# Patient Record
Sex: Male | Born: 1965 | Race: Black or African American | Hispanic: No | Marital: Single | Smoking: Former smoker
Health system: Southern US, Community
[De-identification: ages and names within clinical notes are randomized; demographics above are authoritative.]

---

## 2008-04-04 ENCOUNTER — Emergency Department: Payer: Self-pay | Admitting: Emergency Medicine

## 2016-10-04 ENCOUNTER — Ambulatory Visit: Payer: Self-pay | Admitting: Surgery

## 2016-10-04 NOTE — H&P (Signed)
History of Present Illness Cody Ellis(Cody Bourdeau K. Mekaylah Klich MD; 10/04/2016 9:25 AM) The patient is a 51 year old male who presents with a complaint of Mass. Referred by Dr. Burnell BlanksMaura Ellis for mass on posterior neck This is a healthy 51 year old male who presents with a 3 year history of a slowly enlarging mass on the posterior right side of his neck. This has become much more noticeable and occasionally causes some pain. It has never become infected. 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 Occasionally there is associated soreness with the mass.   Problem List/Past Medical [name](Cody Ellis K. 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 Cody Djordjevic, MD; 10/04/2016 9:26 AM) No pertinent past surgical history  Diagnostic Studies History (Cody Ellis K. Tiwanna Tuch, MD; 10/04/2016 9:26 AM) Colonoscopy never  Allergies [name](Cody Ellis K. Mozes Sagar, MD; 10/04/2016 9:26 AM) No Known Allergies 10/04/2016  Medication History Cody Ellis(Cody Appleman K. Sylvan Lahm, MD; 10/04/2016 9:26 AM) No Current Medications  Social History Cody Ellis(Lucciana Head K. Aastha Dayley, MD; 10/04/2016 9:26 AM) No alcohol use No caffeine use No drug use Tobacco use Never smoker.  Family History Cody Ellis(Cody Carvell K. Alton Tremblay, MD; 10/04/2016 9:26 AM) First Degree Relatives No pertinent family history  Other Problems Cody Ellis(Cody Ellis K. Kailey Esquilin, MD; 10/04/2016 9:26 AM) No pertinent past medical history     Physical Exam [name](Richele Strand K. Stoy Fenn MD; 10/04/2016 9:26 AM)  The physical exam findings are as follows: Note:WDWN in NAD Eyes: Pupils equal, round; sclera anicteric HENT: Oral mucosa moist; good dentition Neck: no thyromegaly; posterior right neck there is a 3 cm subcutaneous mass that is easily visible. This seems to be fairly well demarcated. No sign of inflammation or infection. Lungs: CTA bilaterally; normal respiratory effort CV: Regular rate and rhythm; no murmurs; extremities  well-perfused with no edema Abd: +bowel sounds, soft, non-tender, no palpable organomegaly; no palpable hernias Skin: Warm, dry; no sign of jaundice Psychiatric - alert and oriented x 4; calm mood and affect    Assessment & Plan [name](Roberta Angell K. 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 The surgical procedure has been discussed with the patient. Potential risks, benefits, alternative treatments, and expected outcomes have been explained. All of the patient's questions at this time have been answered. The likelihood of reaching the patient's treatment goal is good. The patient understand the proposed surgical procedure and wishes to proceed.  Cody ArmsMatthew K. Corliss Skainssuei, MD, [facility]  General/ Trauma Surgery  10/04/2016 9:26 AM

## 2017-05-29 ENCOUNTER — Other Ambulatory Visit: Payer: Self-pay | Admitting: Family Medicine

## 2017-05-29 ENCOUNTER — Ambulatory Visit
Admission: RE | Admit: 2017-05-29 | Discharge: 2017-05-29 | Disposition: A | Discharge status: Home / Self Care | Payer: BLUE CROSS/BLUE SHIELD | Source: Ambulatory Visit | Attending: Family Medicine | Admitting: Family Medicine

## 2017-05-29 DIAGNOSIS — R52 Pain, unspecified: Secondary | ICD-10-CM

## 2019-04-16 ENCOUNTER — Ambulatory Visit (HOSPITAL_COMMUNITY)
Admission: EM | Admit: 2019-04-16 | Discharge: 2019-04-16 | Disposition: A | Discharge status: Home / Self Care | Payer: Self-pay | Attending: Urgent Care | Admitting: Urgent Care

## 2019-04-16 ENCOUNTER — Other Ambulatory Visit: Payer: Self-pay

## 2019-04-16 ENCOUNTER — Encounter (HOSPITAL_COMMUNITY): Payer: Self-pay | Admitting: Urgent Care

## 2019-04-16 DIAGNOSIS — L0291 Cutaneous abscess, unspecified: Secondary | ICD-10-CM

## 2019-04-16 DIAGNOSIS — R109 Unspecified abdominal pain: Secondary | ICD-10-CM

## 2019-04-16 MED ORDER — SULFAMETHOXAZOLE-TRIMETHOPRIM 800-160 MG PO TABS: 1.0000 | ORAL_TABLET | Freq: Two times a day (BID) | ORAL | 0 refills | Status: AC

## 2019-04-16 MED ORDER — LIDOCAINE-EPINEPHRINE (PF) 2 %-1:200000 IJ SOLN
INTRAMUSCULAR | Status: AC
  Filled 2019-04-16: qty 20

## 2019-04-16 NOTE — Discharge Instructions (Addendum)
You may take 500mg  Tylenol with ibuprofen 400-600mg  every 6 hours for pain and inflammation associated with your abscess.   Please change your dressing 2-3 times daily. Do not apply any ointments or creams. Each time you change your dressing, make sure you clean gently around the perimeter of the wound with gentle soap and warm water. Cover it using non-stick gauze and medical tape.

## 2019-04-16 NOTE — ED Provider Notes (Signed)
  [facility]   MRN: [id number] DOB: 1965-04-13  Subjective:   Cody Ellis is a 53 y.o. male presenting for 1 week history acute onset worsening boil over left upper flank side.  Patient states that the infection has not drained.  It is very painful and feels hard.  Has not taken medications for relief.  He is not currently taking any medications and has no known food or drug allergies.  Denies past medical and surgical history.   Family History  Problem Relation Age of Onset  . Healthy Mother   . Healthy Father     Social History   Tobacco Use  . Smoking status: Never Smoker  . Smokeless tobacco: Never Used  Substance Use Topics  . Alcohol use: Never  . Drug use: Not on file    ROS Denies fevers, nausea, vomiting, chest pain, belly pain.  Denies history of MRSA infection.  Objective:   Vitals: BP (!) 152/82 (BP Location: Left Arm)   Pulse 76   Temp 98.2 F (36.8 C) (Oral)   Resp 16   SpO2 99%   Physical Exam Constitutional:      General: He is not in acute distress.    Appearance: Normal appearance. He is well-developed and normal weight. He is not ill-appearing, toxic-appearing or diaphoretic.  HENT:     Head: Normocephalic and atraumatic.     Right Ear: External ear normal.     Left Ear: External ear normal.     Nose: Nose normal.     Mouth/Throat:     Pharynx: Oropharynx is clear.  Eyes:     General: No scleral icterus.       Right eye: No discharge.        Left eye: No discharge.     Extraocular Movements: Extraocular movements intact.     Pupils: Pupils are equal, round, and reactive to light.  Cardiovascular:     Rate and Rhythm: Normal rate.  Pulmonary:     Effort: Pulmonary effort is normal.  Chest:    Musculoskeletal:     Cervical back: Normal range of motion.  Neurological:     Mental Status: He is alert and oriented to person, place, and time.  Psychiatric:        Mood and Affect: Mood normal.        Behavior: Behavior  normal.        Thought Content: Thought content normal.        Judgment: Judgment normal.     PROCEDURE NOTE: I&D of Abscess Verbal consent obtained. Local anesthesia with 5cc of 2% lidocaine with epinephrine. Site cleansed with chlorhexidine. Incision of 1 cm was made using a 11 blade, discharge of ~2cc pus and serosanguinous fluid. Wound cavity was explored with curved hemostats and depth of wound is about 1 cm, tracks medially about 2 cm. Cleansed and dressed.   Assessment and Plan :   1. Abscess   2. Left flank pain     I&D performed successfully.  No packing was placed.  Patient is to use warm compresses at home, wound care reviewed.  Start Bactrim.  Use Tylenol and ibuprofen for pain and inflammation. Counseled patient on potential for adverse effects with medications prescribed/recommended today, ER and return-to-clinic precautions discussed, patient verbalized understanding.    Wallis Bamberg, [place] 04/16/19 [phone]

## 2019-04-16 NOTE — ED Triage Notes (Signed)
Patient presents to Urgent Care with complaints of small abscess under left axilla since almost a week ago. Patient reports it is tender to the touch, has not been draining.

## 2020-03-24 ENCOUNTER — Other Ambulatory Visit: Payer: Self-pay

## 2020-03-24 ENCOUNTER — Emergency Department (HOSPITAL_COMMUNITY)
Admission: EM | Admit: 2020-03-24 | Discharge: 2020-03-24 | Disposition: A | Discharge status: Home / Self Care | Payer: Commercial Managed Care - PPO | Attending: Emergency Medicine | Admitting: Emergency Medicine

## 2020-03-24 ENCOUNTER — Encounter (HOSPITAL_COMMUNITY): Payer: Self-pay

## 2020-03-24 ENCOUNTER — Emergency Department (HOSPITAL_COMMUNITY): Payer: Commercial Managed Care - PPO

## 2020-03-24 DIAGNOSIS — R109 Unspecified abdominal pain: Secondary | ICD-10-CM

## 2020-03-24 DIAGNOSIS — Z87891 Personal history of nicotine dependence: Secondary | ICD-10-CM | POA: Diagnosis not present

## 2020-03-24 DIAGNOSIS — R1012 Left upper quadrant pain: Secondary | ICD-10-CM | POA: Insufficient documentation

## 2020-03-24 DIAGNOSIS — R1032 Left lower quadrant pain: Secondary | ICD-10-CM | POA: Diagnosis not present

## 2020-03-24 LAB — CBC WITH DIFFERENTIAL/PLATELET
Abs Immature Granulocytes: 0.01 10*3/uL (ref 0.00–0.07)
Basophils Absolute: 0 10*3/uL (ref 0.0–0.1)
Basophils Relative: 1 %
Eosinophils Absolute: 0 10*3/uL (ref 0.0–0.5)
Eosinophils Relative: 1 %
HCT: 41.5 % (ref 39.0–52.0)
Hemoglobin: 12.9 g/dL — ABNORMAL LOW (ref 13.0–17.0)
Immature Granulocytes: 0 %
Lymphocytes Relative: 40 %
Lymphs Abs: 1.7 10*3/uL (ref 0.7–4.0)
MCH: 25.7 pg — ABNORMAL LOW (ref 26.0–34.0)
MCHC: 31.1 g/dL (ref 30.0–36.0)
MCV: 82.7 fL (ref 80.0–100.0)
Monocytes Absolute: 0.4 10*3/uL (ref 0.1–1.0)
Monocytes Relative: 10 %
Neutro Abs: 2 10*3/uL (ref 1.7–7.7)
Neutrophils Relative %: 48 %
Platelets: 218 10*3/uL (ref 150–400)
RBC: 5.02 MIL/uL (ref 4.22–5.81)
RDW: 12.9 % (ref 11.5–15.5)
WBC: 4.3 10*3/uL (ref 4.0–10.5)
nRBC: 0 % (ref 0.0–0.2)

## 2020-03-24 LAB — URINALYSIS, ROUTINE W REFLEX MICROSCOPIC
Bilirubin Urine: NEGATIVE
Glucose, UA: NEGATIVE mg/dL
Hgb urine dipstick: NEGATIVE
Ketones, ur: NEGATIVE mg/dL
Leukocytes,Ua: NEGATIVE
Nitrite: NEGATIVE
Protein, ur: NEGATIVE mg/dL
Specific Gravity, Urine: 1.015 (ref 1.005–1.030)
pH: 7 (ref 5.0–8.0)

## 2020-03-24 LAB — COMPREHENSIVE METABOLIC PANEL
ALT: 20 U/L (ref 0–44)
AST: 24 U/L (ref 15–41)
Albumin: 4.4 g/dL (ref 3.5–5.0)
Alkaline Phosphatase: 75 U/L (ref 38–126)
Anion gap: 11 (ref 5–15)
BUN: 9 mg/dL (ref 6–20)
CO2: 23 mmol/L (ref 22–32)
Calcium: 9 mg/dL (ref 8.9–10.3)
Chloride: 103 mmol/L (ref 98–111)
Creatinine, Ser: 0.86 mg/dL (ref 0.61–1.24)
GFR, Estimated: >60 mL/min (ref >60)
Glucose, Bld: 139 mg/dL — ABNORMAL HIGH (ref 70–99)
Potassium: 3.4 mmol/L — ABNORMAL LOW (ref 3.5–5.1)
Sodium: 137 mmol/L (ref 135–145)
Total Bilirubin: 0.4 mg/dL (ref 0.3–1.2)
Total Protein: 7.8 g/dL (ref 6.5–8.1)

## 2020-03-24 LAB — LIPASE, BLOOD: Lipase: 35 U/L (ref 11–51)

## 2020-03-24 MED ORDER — IOHEXOL 300 MG/ML  SOLN
100.0000 mL | Freq: Once | INTRAMUSCULAR | Status: AC | PRN
  Administered 2020-03-24: 100 mL via INTRAVENOUS

## 2020-03-24 NOTE — Discharge Instructions (Addendum)
Your blood tests and CT scan of the abdomen were normal.  You should call to find a new primary care provider in the area.

## 2020-03-24 NOTE — ED Triage Notes (Addendum)
Pt c/o headache and generalized abdominal pain radiating to the groin area x3 weeks. Pt denies n/v/d, but sts his stool today looked "different". Pt sts when he takes ibuprofen all of the pain goes away, but comes back when the medicine wears off. Pt sts he feels something hard when the pain moves.

## 2020-03-24 NOTE — ED Notes (Signed)
Pt made aware of the need for a urine sample. °

## 2020-03-24 NOTE — ED Provider Notes (Signed)
[facility]-EMERGENCY DEPT Provider Note   CSN: [id number] Arrival date & time: 03/24/20  1433     History Chief Complaint  Patient presents with  . Abdominal Pain    Cody Ellis is a 55 y.o. male w/ no known significant history presenting to the ED with abdominal pain, ongoing for 3-4 weeks.  He has a nondescript "hardness" that appears at times in his stomach, left lower and left upper side, and comes and goes at random.  Seems worse with bowel movements.  No dysuria, hematuria.  NO known hx of hernia.  Eating and having regular BM without nausea, vomiting.  No fevers or chills  Pain seems to improve with NSAIDS/ibuprofen at home.  He does not have a PCP  NKDA.  Takes no medications  HPI     History reviewed. No pertinent past medical history.  There are no problems to display for this patient.   History reviewed. No pertinent surgical history.     History reviewed. No pertinent family history.  Social History   Tobacco Use  . Smoking status: Former Smoker    Types: Cigarettes    Quit date: 10/15/2007    Years since quitting: 12.4  . Smokeless tobacco: Never Used  Vaping Use  . Vaping Use: Never used  Substance Use Topics  . Alcohol use: Never  . Drug use: Never    Home Medications Prior to Admission medications   Not on File    Allergies    Patient has no known allergies.  Review of Systems   Review of Systems  Constitutional: Negative for chills and fever.  HENT: Negative for ear pain and sore throat.   Eyes: Negative for pain and visual disturbance.  Respiratory: Negative for cough and shortness of breath.   Cardiovascular: Negative for chest pain and palpitations.  Gastrointestinal: Positive for abdominal pain. Negative for diarrhea and vomiting.  Genitourinary: Negative for dysuria and hematuria.  Musculoskeletal: Negative for arthralgias and back pain.  Skin: Negative for color change and rash.  Neurological:  Negative for seizures and syncope.  All other systems reviewed and are negative.   Physical Exam Updated Vital Signs BP 129/82   Pulse 70   Temp 98.8 F (37.1 C) (Oral)   Resp 16   Ht 6\' 2"  (1.88 m)   Wt 74.8 kg   SpO2 100%   BMI 21.18 kg/m   Physical Exam Constitutional:      General: He is not in acute distress. HENT:     Head: Normocephalic and atraumatic.  Eyes:     Conjunctiva/sclera: Conjunctivae normal.     Pupils: Pupils are equal, round, and reactive to light.  Cardiovascular:     Rate and Rhythm: Normal rate and regular rhythm.  Pulmonary:     Effort: Pulmonary effort is normal. No respiratory distress.  Abdominal:     General: There is no distension.     Tenderness: There is no abdominal tenderness. There is no guarding or rebound. Negative signs include Murphy's sign and McBurney's sign.  Skin:    General: Skin is warm and dry.  Neurological:     General: No focal deficit present.     Mental Status: He is alert. Mental status is at baseline.  Psychiatric:        Mood and Affect: Mood normal.        Behavior: Behavior normal.     ED Results / Procedures / Treatments   Labs (all labs ordered are listed,  but only abnormal results are displayed) Labs Reviewed  COMPREHENSIVE METABOLIC PANEL - Abnormal; Notable for the following components:      Result Value   Potassium 3.4 (*)    Glucose, Bld 139 (*)    All other components within normal limits  CBC WITH DIFFERENTIAL/PLATELET - Abnormal; Notable for the following components:   Hemoglobin 12.9 (*)    MCH 25.7 (*)    All other components within normal limits  LIPASE, BLOOD  URINALYSIS, ROUTINE W REFLEX MICROSCOPIC    EKG EKG Interpretation  Date/Time:  Friday March 24 2020 15:08:19 EST Ventricular Rate:  75 PR Interval:    QRS Duration: 110 QT Interval:  362 QTC Calculation: 405 R Axis:   4 Text Interpretation: Sinus rhythm Multiple ventricular premature complexes Abnormal R-wave  progression, early transition 12 Lead; Mason-Likar No STEMI Confirmed by Alvester Chou [phone]) on 03/24/2020 3:13:14 PM   Radiology CT ABDOMEN PELVIS W CONTRAST  Result Date: 03/24/2020 CLINICAL DATA:  Generalized abdominal pain radiating to groin EXAM: CT ABDOMEN AND PELVIS WITH CONTRAST TECHNIQUE: Multidetector CT imaging of the abdomen and pelvis was performed using the standard protocol following bolus administration of intravenous contrast. CONTRAST:  OMNIPAQUE IOHEXOL 300 MG/ML  SOLN COMPARISON:  None. FINDINGS: Lower chest: Lung bases are clear. No effusions. Heart is normal size. Hepatobiliary: No focal hepatic abnormality. Gallbladder unremarkable. Pancreas: No focal abnormality or ductal dilatation. Spleen: No focal abnormality.  Normal size. Adrenals/Urinary Tract: No adrenal abnormality. No focal renal abnormality. No stones or hydronephrosis. Urinary bladder is unremarkable. Stomach/Bowel: Stomach, large and small bowel grossly unremarkable. Normal appendix. Vascular/Lymphatic: No evidence of aneurysm or adenopathy. Reproductive: No visible focal abnormality. Other: No free fluid or free air. Musculoskeletal: No acute bony abnormality. IMPRESSION: No acute findings in the abdomen or pelvis. Electronically Signed   By: Charlett Nose M.D.   On: 03/24/2020 19:49    Procedures Procedures   Medications Ordered in ED Medications  iohexol (OMNIPAQUE) 300 MG/ML solution 100 mL (100 mLs Intravenous Contrast Given 03/24/20 1922)    ED Course  I have reviewed the triage vital signs and the nursing notes.  Pertinent labs & imaging results that were available during my care of the patient were reviewed by me and considered in my medical decision making (see chart for details).  This patient presents to the Emergency Department with complaint of abdominal pain. This involves an extensive number of treatment options, and is a complaint that carries with it a high risk of complications and  morbidity.  The differential diagnosis includes gastritis vs hernia vs constipation vs inflammatory bowel disease vs intraabdominal infection vs other  I ordered, reviewed, and interpreted labs, including CMP, lipase, CBC, UA.  There were no immediate, life-threatening emergencies found in this labwork.  Patient's UA showed  no signs of infection I ordered imaging studies which included CT abdomen and pelvis with IV contrast I independently visualized and interpreted imaging which showed no acute pathology and the monitor tracing which showed NSR I personally reviewed the patients ECG which showed sinus rhythm with no acute ischemic findings  After the interventions stated above, I reevaluated the patient and found that they remained clinically stable.  Based on the patient's clinical exam, vital signs, risk factors, and ED testing, I felt that the patient's overall risk of life-threatening emergency such as bowel perforation, surgical emergency, or sepsis was quite low.   I discussed return precautions with the patient. I felt the patient was clinically stable for  discharge.   Clinical Course as of 03/25/20 0011  Caleen Essex Mar 24, 2020  1827 Asymptomatic.  Feeling well.  Updated regarding w/u - unremarkable here including CT.  Okay to discharge [MT]    Clinical Course User Index [MT] Faline Langer, Kermit Balo, MD    Final Clinical Impression(s) / ED Diagnoses Final diagnoses:  Abdominal pain, unspecified abdominal location    Rx / DC Orders ED Discharge Orders    None       Renaye Rakers Kermit Balo, MD 03/25/20 [phone]

## 2020-03-27 ENCOUNTER — Encounter (HOSPITAL_COMMUNITY): Payer: Self-pay | Admitting: Urgent Care

## 2021-07-25 IMAGING — CT CT ABD-PELV W/ CM
2 of 5 series · 16 of 46 positions shown, 18 images · IV contrast (omnipaque)
Comparison: None.

CLINICAL DATA: Generalized abdominal pain radiating to groin

EXAM:
CT ABDOMEN AND PELVIS WITH CONTRAST
TECHNIQUE: Multidetector CT imaging of the abdomen and pelvis was performed
using the standard protocol following bolus administration of
intravenous contrast.
CONTRAST:  100mL OMNIPAQUE IOHEXOL 300 MG/ML  SOLN

[Series 2: axial st · axial · 0.77mm/px · z∈[+1254,+1674]mm · 13 of 96 slices shown, 15 images]
[im 6/96  soft-tissue]
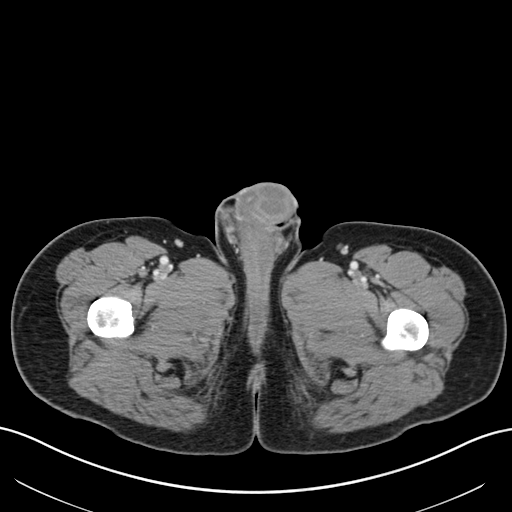
[im 6/96  bone]
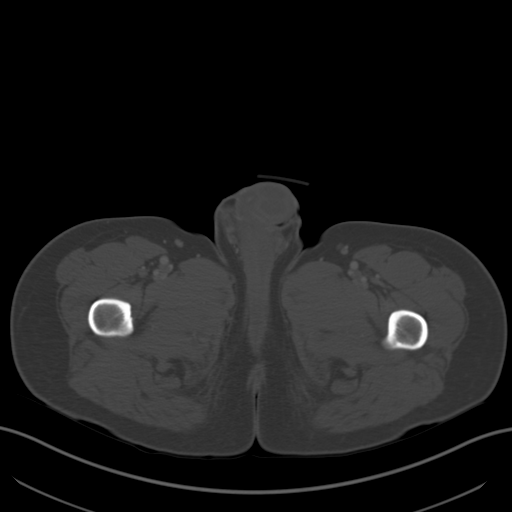
[im 11/96  soft-tissue]
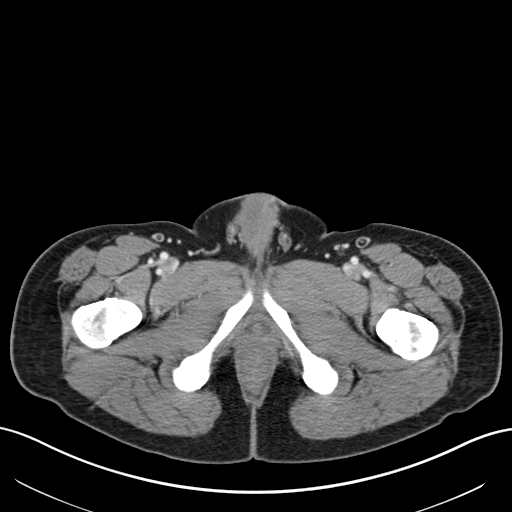
[im 22/96  soft-tissue]
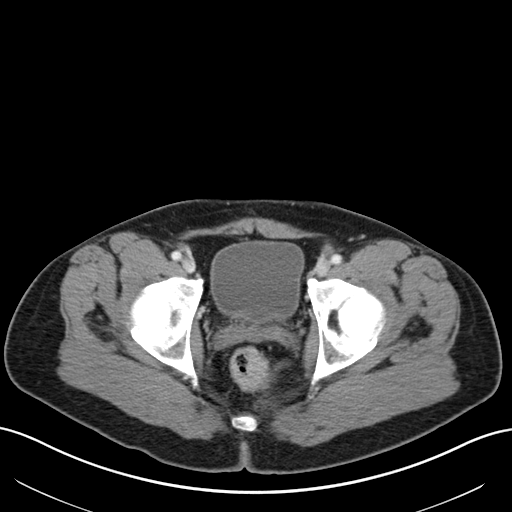
[im 27/96  soft-tissue]
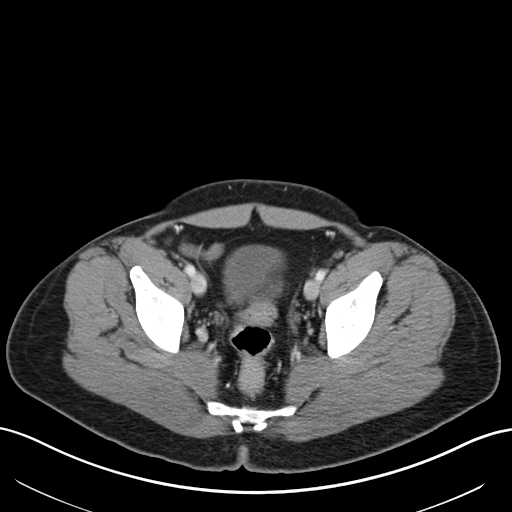
[im 32/96  soft-tissue]
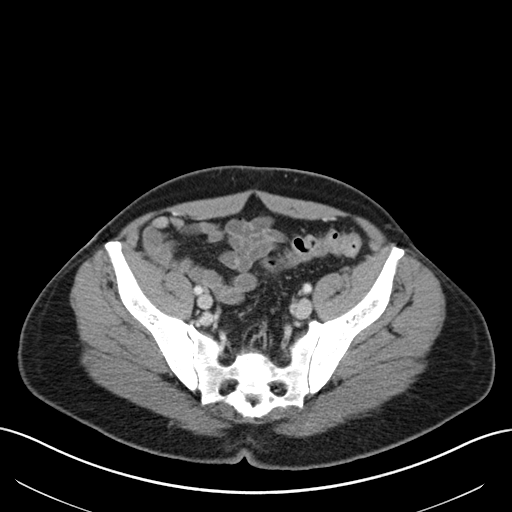
[im 43/96  soft-tissue]
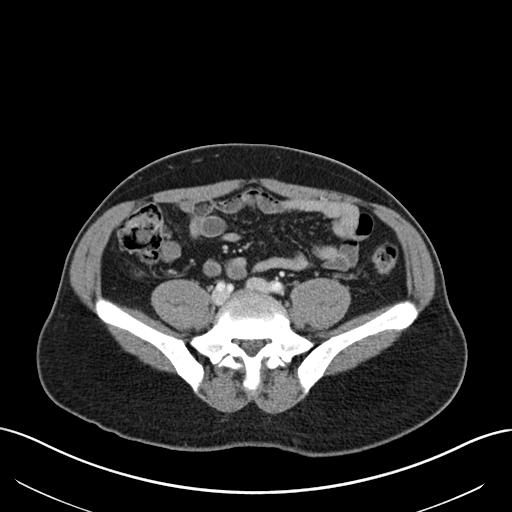
[im 48/96  soft-tissue]
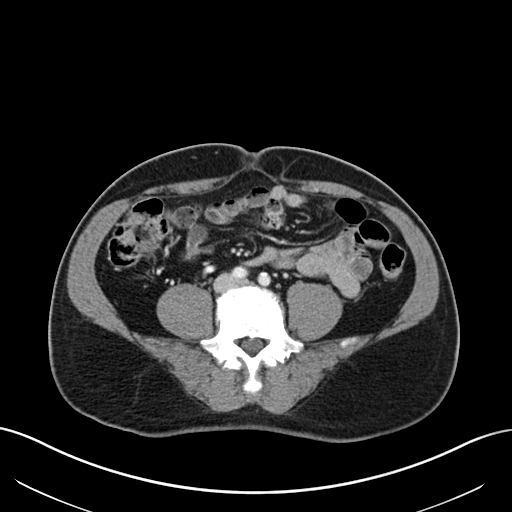
[im 53/96  soft-tissue]
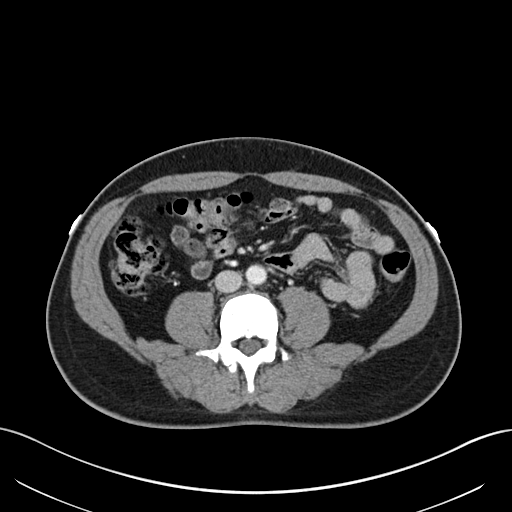
[im 64/96  soft-tissue]
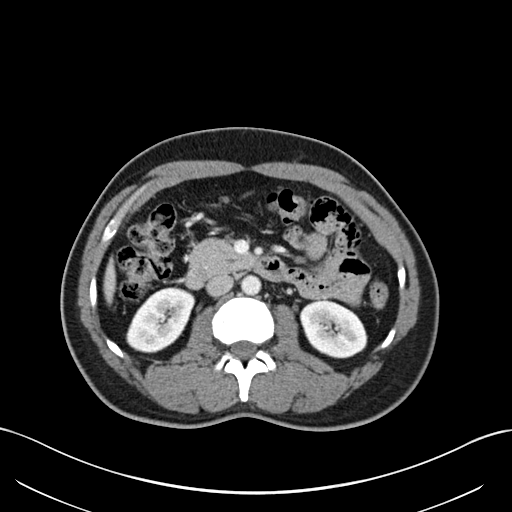
[im 64/96  bone]
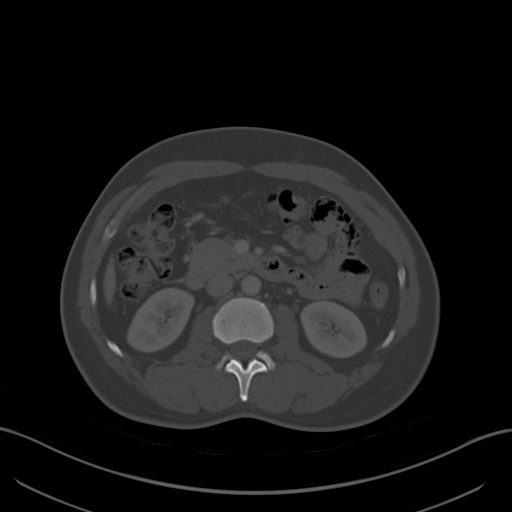
[im 69/96  soft-tissue]
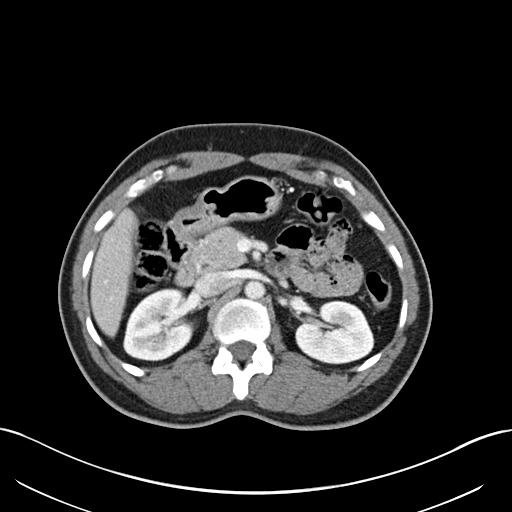
[im 74/96  soft-tissue]
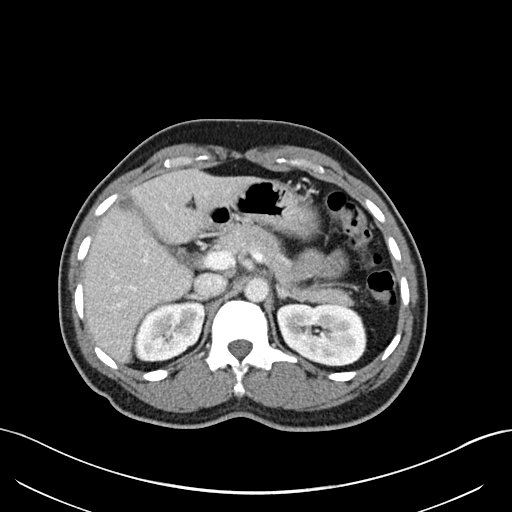
[im 85/96  soft-tissue]
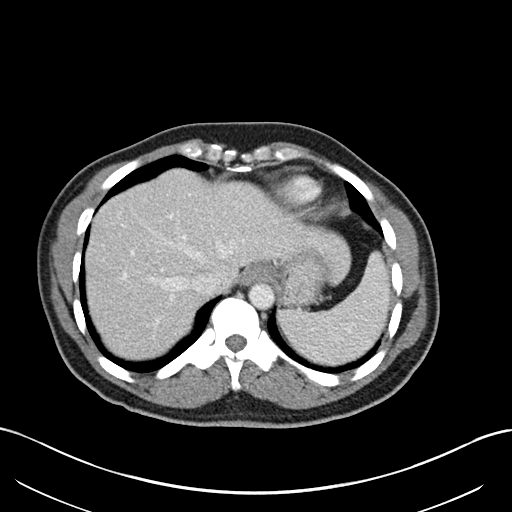
[im 90/96  soft-tissue]
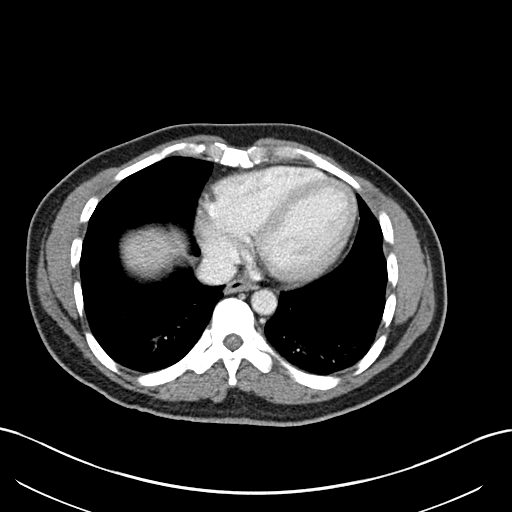

[Series 6: coronal st · coronal · 0.63mm/px · 3 of 140 slices shown]
[im 47/140  soft-tissue]
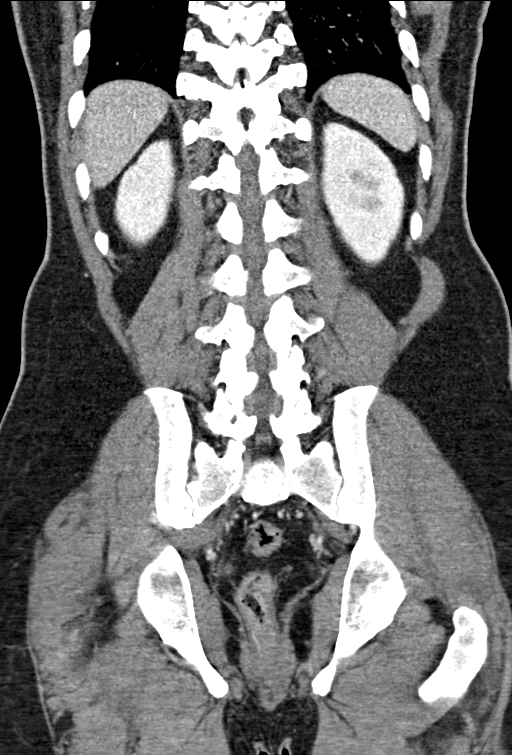
[im 62/140  soft-tissue]
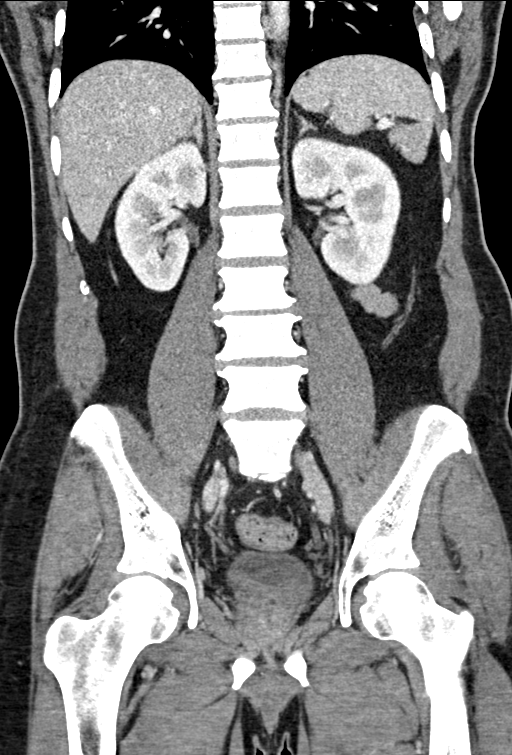
[im 78/140  soft-tissue]
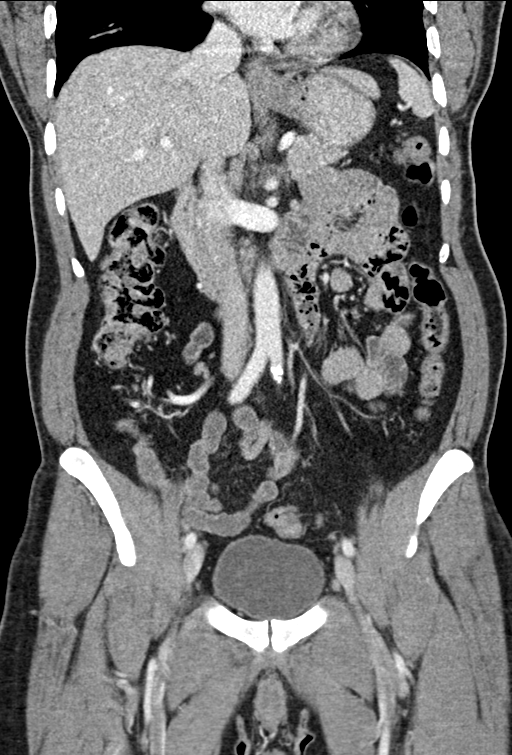

[16 of 46 positions shown; findings below may reference images not displayed]

FINDINGS: Lower chest: Lung bases are clear. No effusions. Heart is normal
size.

Hepatobiliary: No focal hepatic abnormality. Gallbladder
unremarkable.

Pancreas: No focal abnormality or ductal dilatation.

Spleen: No focal abnormality.  Normal size.

Adrenals/Urinary Tract: No adrenal abnormality. No focal renal
abnormality. No stones or hydronephrosis. Urinary bladder is
unremarkable.

Stomach/Bowel: Stomach, large and small bowel grossly unremarkable.
Normal appendix.

Vascular/Lymphatic: No evidence of aneurysm or adenopathy.

Reproductive: No visible focal abnormality.

Other: No free fluid or free air.

Musculoskeletal: No acute bony abnormality.
IMPRESSION: No acute findings in the abdomen or pelvis.

## 2021-08-27 ENCOUNTER — Encounter: Payer: Commercial Managed Care - PPO | Admitting: Physician Assistant

## 2021-08-27 NOTE — Progress Notes (Deleted)
Complete physical exam   Patient: Cody Ellis   DOB: 02-04-1966   56 y.o. Male  MRN: [id number] Visit Date: 08/27/2021  No chief complaint on file.  Subjective    Cody Ellis is a 56 y.o. male who presents today for a complete physical exam.   Reports is generally feeling well, fairly well, poorly*; is eating a *** diet; is sleeping well ***; drinks *** bottles of water a day; is exercising ***   HPI  ***  No past medical history on file. No past surgical history on file. Social History   Socioeconomic History  . Marital status: Single    Spouse name: Not on file  . Number of children: Not on file  . Years of education: Not on file  . Highest education level: Not on file  Occupational History  . Not on file  Tobacco Use  . Smoking status: Former    Types: Cigarettes    Quit date: 10/15/2007    Years since quitting: 13.8  . Smokeless tobacco: Never  Vaping Use  . Vaping Use: Never used  Substance and Sexual Activity  . Alcohol use: Never  . Drug use: Never  . Sexual activity: Not on file  Other Topics Concern  . Not on file  Social History Narrative   ** Merged History Encounter **       Social Determinants of Health   Financial Resource Strain: Not on file  Food Insecurity: Not on file  Transportation Needs: Not on file  Physical Activity: Not on file  Stress: Not on file  Social Connections: Not on file  Intimate Partner Violence: Not on file   Family Status  Relation Name Status  . Mother  Deceased  . Father  Deceased   Family History  Problem Relation Age of Onset  . Healthy Mother   . Healthy Father    No Known Allergies  Patient Care Team: Patient, No Pcp Per as PCP - General (General Practice) Hamrick, Durward Fortes, MD (Family Medicine)   Medications: Outpatient Medications Prior to Visit  Medication Sig  . sulfamethoxazole-trimethoprim (BACTRIM DS) 800-160 MG tablet Take 1 tablet by mouth 2 (two) times daily.   No  facility-administered medications prior to visit.    Review of Systems  {Labs (Optional):23779}  The ASCVD Risk score (Arnett DK, et al., 2019) failed to calculate for the following reasons:   The systolic blood pressure is missing   Cannot find a previous HDL lab   Cannot find a previous total cholesterol lab   Objective    There were no vitals taken for this visit.  {Show previous vital signs (optional):23777}   Physical Exam  ***  Last depression screening scores     No data to display         Last fall risk screening     No data to display           No results found for any visits on 08/27/21.  Assessment & Plan    Routine Health Maintenance and Physical Exam  Exercise Activities and Dietary recommendations  Goals   None      There is no immunization history on file for this patient.  Health Maintenance  Topic Date Due  . HIV Screening  Never done  . Hepatitis C Screening  Never done  . TETANUS/TDAP  Never done  . COLONOSCOPY (Pts 45-12yrs Insurance coverage will need to be confirmed)  Never done  . Zoster Vaccines- Shingrix (1  of 2) Never done  . INFLUENZA VACCINE  09/25/2021  . HPV VACCINES  Aged Out    Discussed health benefits of physical activity, and encouraged him to engage in regular exercise appropriate for his age and condition.  Problem List Items Addressed This Visit   None    No follow-ups on file.     Jake Shark, PA-C
